# Patient Record
Sex: Female | Born: 1985 | Race: White | Hispanic: No | Marital: Single | Smoking: Never smoker
Health system: Southern US, Community
[De-identification: ages and names within clinical notes are randomized; demographics above are authoritative.]

## PROBLEM LIST (undated history)

## (undated) DIAGNOSIS — E039 Hypothyroidism, unspecified: Secondary | ICD-10-CM

---
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
## 2006-03-05 ENCOUNTER — Ambulatory Visit (HOSPITAL_COMMUNITY): Admission: RE | Admit: 2006-03-05 | Discharge: 2006-03-05 | Payer: Self-pay | Admitting: Family Medicine

## 2006-04-13 ENCOUNTER — Ambulatory Visit (HOSPITAL_COMMUNITY): Admission: RE | Admit: 2006-04-13 | Discharge: 2006-04-13 | Payer: Self-pay | Admitting: Family Medicine

## 2009-12-14 ENCOUNTER — Encounter: Admission: RE | Admit: 2009-12-14 | Discharge: 2009-12-14 | Payer: Self-pay | Admitting: Emergency Medicine

## 2011-04-12 ENCOUNTER — Other Ambulatory Visit: Payer: Self-pay | Admitting: Orthopedic Surgery

## 2011-04-12 DIAGNOSIS — M25532 Pain in left wrist: Secondary | ICD-10-CM

## 2011-04-18 ENCOUNTER — Ambulatory Visit
Admission: RE | Admit: 2011-04-18 | Discharge: 2011-04-18 | Disposition: A | Discharge status: Home / Self Care | Payer: 59 | Source: Ambulatory Visit | Attending: Orthopedic Surgery | Admitting: Orthopedic Surgery

## 2011-04-18 DIAGNOSIS — M25532 Pain in left wrist: Secondary | ICD-10-CM

## 2011-04-18 MED ORDER — GADOBENATE DIMEGLUMINE 529 MG/ML IV SOLN
0.1000 mL | Freq: Once | INTRAVENOUS | Status: AC | PRN
  Administered 2011-04-18: 0.1 mL via INTRAVENOUS

## 2011-04-18 MED ORDER — IOHEXOL 180 MG/ML  SOLN
1.0000 mL | Freq: Once | INTRAMUSCULAR | Status: AC | PRN
  Administered 2011-04-18: 1 mL via INTRA_ARTICULAR

## 2011-05-07 ENCOUNTER — Ambulatory Visit (HOSPITAL_BASED_OUTPATIENT_CLINIC_OR_DEPARTMENT_OTHER)
Admission: RE | Admit: 2011-05-07 | Discharge: 2011-05-07 | Disposition: A | Discharge status: Home / Self Care | Payer: 59 | Source: Ambulatory Visit | Attending: Orthopedic Surgery | Admitting: Orthopedic Surgery

## 2011-05-07 DIAGNOSIS — Y9345 Activity, cheerleading: Secondary | ICD-10-CM | POA: Insufficient documentation

## 2011-05-07 DIAGNOSIS — S63599A Other specified sprain of unspecified wrist, initial encounter: Secondary | ICD-10-CM | POA: Insufficient documentation

## 2011-05-07 DIAGNOSIS — X500XXA Overexertion from strenuous movement or load, initial encounter: Secondary | ICD-10-CM | POA: Insufficient documentation

## 2011-05-07 DIAGNOSIS — S66819A Strain of other specified muscles, fascia and tendons at wrist and hand level, unspecified hand, initial encounter: Secondary | ICD-10-CM | POA: Insufficient documentation

## 2011-05-07 DIAGNOSIS — Z01812 Encounter for preprocedural laboratory examination: Secondary | ICD-10-CM | POA: Insufficient documentation

## 2011-05-07 DIAGNOSIS — Y998 Other external cause status: Secondary | ICD-10-CM | POA: Insufficient documentation

## 2011-05-24 NOTE — Op Note (Signed)
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  PROCEDURES:  Left wrist arthroscopy, triangular fibrocartilage tear debridement and ulnar shortening osteotomy.  SURGEON:  Betha Loa, MD  ASSISTANT:  Cindee Salt, MD  ANESTHESIA:  General with regional.  IV FLUIDS:  Per anesthesia flow sheet.  ESTIMATED BLOOD LOSS:  Minimal.  COMPLICATIONS:  None.  SPECIMENS:  None.  TOURNIQUET TIME:  Eighty-two minutes.  DISPOSITION:  Stable to PACU.  INDICATIONS:  Ms. Dejager is a 25 year old white female who works as a Programmer, systems.  At the end of June while catching another individual, she had a forced supination of her wrist.  She felt a pop and had pain in the wrist.  She followed up with me in the office.  She has stable distal radioulnar joint.  She had pain at the ulnar side of the wrist.  An MRI was obtained showing evidence of ulnar carpal abutment with changes in the lunate as well as central TFCC tear.  She was on ulnar positive.  Discussed with Ms. Lingelbach the nature of her condition.  We discussed nonoperative treatment with splinting versus operative treatment with wrist arthroscopy, debridement and ulnar shortening osteotomy.  The risks, benefits, and alternative surgery were discussed including the risk of blood loss, infection, damage to nerves, vessels, tendons, ligaments bone, failure of procedure, need for additional procedures, complications with wound healing, continued pain, nonunion, malunion,  stiffness.  She voiced of these risks and elected to proceed.  OPERATIVE COURSE:  After being identified preoperatively by myself, the patient and I agreed upon procedure and site of procedure.  The surgery site was marked.  The risks, benefits, and alternatives of surgery were reviewed and she wished to proceed.  She was given 1 gram of IV Ancef as preoperative antibiotic prophylaxis.  Regional block was performed by Anesthesia in preoperative holding.  She was transferred to operating room, placed on the operating table in supine position with left upper extremity on arm board.  General esthesia was induced by anesthesiologist.  Left upper extremity was prepped and draped in normal sterile orthopedic fashion.  A surgical pause was performed between surgeons, Anesthesia, operating staff, and all in agreement with the patient procedure and site procedure.  The left upper extremity was secured in the arthroscopy tower.  A 3/4 arthroscopic portal was made by incising the skin using spreading technique in the deeper tissues.  The radiocarpal joint was entered.  The camera was used.  The joint was explored.  The scapholunate ligament was intact.  The lunotriquetral ligament was intact.  There was a central TFCC tear.  The volar and dorsal condensations were intact.  There was  no peripheral tear.  An 18- gauge needle had been placed on the ulnar side of the wrist for drainage.  The shaver was introduced through the 4/5 portal.  The central TFCC tear was debrided back to a stable edge.  The camera was placed through the 4/5 portal as well.  The ulnar side of the lunate was noted to be with some degenerative change, likely secondary to the abutment.  A 6-hour portal had been made for probing.  The arthroscopy equipment was removed.  The tourniquet at the proximal aspect of the upper extremity was inflated to 250 mmHg after exsanguination of limb with an Esmarch bandage.  A longitudinal incision  was made at the ulnar side of the forearm over the subcutaneous border of the ulna.  This was carried into the subcutaneous tissues by spreading technique.  Care was taken to protect all neurovascular structures.  Bipolar was used for electrocautery as necessary.  The interval between the FCU and ECU was identified and created.  The FCU was retracted radially.  The periosteum was incised and elevated using periosteal elevator and Therapist, nutritional. The Acumed ulnar osteotomy plate was used.  It was placed on the bone. The technique was followed.  The plate was secured at its distal screw hole with a locking screw.  Standard AO drilling measuring technique was used.  The peg was placed in the slotted hole more proximally.  K-wires were used to secure the plate and olive wire was placed proximally as well.  The cutting jig was placed.  The osteotomy cut was made to remove 2 mm of bone.  The oscillating saw was used to make the osteotomy.  The olive was removed.  The cutting jig was removed.  Compression clamp was used and good compression was obtained at the osteotomy site.  The compression screw was placed again using standard AO drilling measuring technique.  A lag screw was placed across the osteotomy site.  A locking screw had been placed distally prior to this for stabilization of the fragment.  The remaining distal hole was then filled again using a locking screw.  The most proximal hole was then filled with a locking screw and the slotted hole was filled with a nonlocking screw per the technique.  Good compression had been obtained at the osteotomy site.  C- arm was used in AP and lateral projections to ensure appropriate reduction of the osteotomy and good compression which was the case. Radiographs of the wrist showed good shortening of the ulna.  The wound was copiously irrigated with 500 mL of sterile saline.  The fascia was repaired using 4-0 Vicryl suture in a running fashion.  A  4-0 Vicryl was used in inverted fashion in the subcutaneous tissues and skin was closed with a running 4-0 Monocryl suture subcuticularly.  The wound was then stabilized using Steri-Strips.  The arthroscopy wounds were closed using the 4-0 Monocryl suture in an interrupted fashion.  The wounds were all dressed with sterile Xeroform, 4 x 4's, and wrapped with Kerlix bandage. A sugar-tong splints placed and wrapped with Kerlix and Ace bandage. The operative drapes were broken down, the tourniquet deflated at 82 minutes.  The fingertips were pink with brisk capillary refill after deflation of tourniquet.  Operative drapes were broken down.  The patient was awoken from anesthesia safely.  She is transferred back to stretcher and taken to PACU in stable condition.  I will see her back in the office in 1 week for postoperative  followup.  I will give her Percocet 5/325 one to two p.o. q.6 h. p.r.n. pain, dispensed #50.     Betha Loa, MD     KK/MEDQ  D:  05/07/2011  T:  05/08/2011  Job:  [id number]  Electronically Signed by Betha Loa  on 05/24/2011 10:13:56 AM

## 2013-08-01 ENCOUNTER — Encounter (HOSPITAL_BASED_OUTPATIENT_CLINIC_OR_DEPARTMENT_OTHER): Payer: Self-pay | Admitting: Emergency Medicine

## 2013-08-01 ENCOUNTER — Emergency Department (HOSPITAL_BASED_OUTPATIENT_CLINIC_OR_DEPARTMENT_OTHER)
Admission: EM | Admit: 2013-08-01 | Discharge: 2013-08-02 | Disposition: A | Discharge status: Home / Self Care | Payer: Self-pay | Attending: Emergency Medicine | Admitting: Emergency Medicine

## 2013-08-01 ENCOUNTER — Emergency Department (HOSPITAL_BASED_OUTPATIENT_CLINIC_OR_DEPARTMENT_OTHER): Payer: Self-pay

## 2013-08-01 DIAGNOSIS — Z79899 Other long term (current) drug therapy: Secondary | ICD-10-CM | POA: Insufficient documentation

## 2013-08-01 DIAGNOSIS — S5001XA Contusion of right elbow, initial encounter: Secondary | ICD-10-CM

## 2013-08-01 DIAGNOSIS — Y929 Unspecified place or not applicable: Secondary | ICD-10-CM | POA: Insufficient documentation

## 2013-08-01 DIAGNOSIS — Y9389 Activity, other specified: Secondary | ICD-10-CM | POA: Insufficient documentation

## 2013-08-01 DIAGNOSIS — X58XXXA Exposure to other specified factors, initial encounter: Secondary | ICD-10-CM | POA: Insufficient documentation

## 2013-08-01 DIAGNOSIS — S5000XA Contusion of unspecified elbow, initial encounter: Secondary | ICD-10-CM | POA: Insufficient documentation

## 2013-08-01 NOTE — ED Provider Notes (Signed)
CSN: [id number]     Arrival date & time 08/01/13  2247 History  This chart was scribed for Dione Booze, MD by Dorothey Baseman, ED Scribe. This patient was seen in room MH05/MH05 and the patient's care was started at 11:49 PM.    Chief Complaint  Patient presents with  . Elbow Pain   The history is provided by the patient. No language interpreter was used.   HPI Comments: Beth Dominguez is a 27 y.o. female who presents to the Emergency Department complaining of a constant pain, 8/10 currently, to the right elbow onset around 2 hours ago when the patient reports that she was breaking up a dog fight and either hit the elbow on the counter or fell on it. She reports that the pain is exacerbated with rotation and flexion. She reports allergies to doxycycline and Zithromax. Patient is a non-smoker and a social drinker. Patient denies any other pertinent medical history.   History reviewed. No pertinent past medical history. History reviewed. No pertinent past surgical history. History reviewed. No pertinent family history. History  Substance Use Topics  . Smoking status: Never Smoker   . Smokeless tobacco: Not on file  . Alcohol Use: Yes     Comment: social   OB History   Grav Para Term Preterm Abortions TAB SAB Ect Mult Living                 Review of Systems  A complete 10 system review of systems was obtained and all systems are negative except as noted in the HPI and PMH.   Allergies  Doxycycline and Zithromax  Home Medications   Current Outpatient Rx  Name  Route  Sig  Dispense  Refill  . venlafaxine (EFFEXOR) 75 MG tablet   Oral   Take 75 mg by mouth 2 (two) times daily.          Triage Vitals: BP 142/93  Temp(Src) 98.5 F (36.9 C) (Oral)  Resp 16  SpO2 100%  Physical Exam  Nursing note and vitals reviewed. Constitutional: She is oriented to person, place, and time. She appears well-developed and well-nourished. No distress.  HENT:  Head: Normocephalic and  atraumatic.  Eyes: Conjunctivae are normal.  Neck: Normal range of motion. Neck supple.  Pulmonary/Chest: Effort normal. No respiratory distress.  Abdominal: She exhibits no distension.  Musculoskeletal: Normal range of motion. She exhibits edema and tenderness.  Right elbow has mild swelling and tenderness posterolaterally. Moderate pain on full flexion of the right elbow. Distal neurovascular intact.   Neurological: She is alert and oriented to person, place, and time.  Skin: Skin is warm and dry.  Psychiatric: She has a normal mood and affect. Her behavior is normal.    ED Course  Procedures (including critical care time)  DIAGNOSTIC STUDIES: Oxygen Saturation is 100% on room air, normal by my interpretation.    COORDINATION OF CARE: 11:51 PM- Discussed negative x-ray results. Will discharge patient with a sling. Advised patient to apply ice to the area and to take ibuprofen or Advil at home to manage symptoms. Discussed treatment plan with patient at bedside and patient verbalized agreement.    Imaging Review Dg Elbow Complete Right  08/01/2013   CLINICAL DATA:  Injury of the right elbow complaining of pain in the right elbow.  EXAM: RIGHT ELBOW - COMPLETE 3+ VIEW  COMPARISON:  No priors.  FINDINGS: Multiple views of the right elbow demonstrate no acute displaced fracture, subluxation, dislocation, or soft tissue  abnormality.  IMPRESSION: No acute radiographic abnormality of the right elbow.   Electronically Signed   By: Trudie Reed M.D.   On: 08/01/2013 23:25   Images viewed by me.  MDM   1. Contusion of right elbow, initial encounter    Right elbow injury that appears to be contusion. X-ray shows no evidence of fracture and no fat pad sign. Patient is placed in a sling for comfort and reassured. She is discharged with instructions to use over-the-counter analgesics as needed for pain.  I personally performed the services described in this documentation, which was scribed  in my presence. The recorded information has been reviewed and is accurate.    Dione Booze, MD 08/02/13 [phone]

## 2013-08-01 NOTE — ED Notes (Signed)
Breaking up dog fight an hour ago, hit right elbow on counter or fell on it, not sure, but increased pain

## 2014-02-17 ENCOUNTER — Emergency Department (HOSPITAL_BASED_OUTPATIENT_CLINIC_OR_DEPARTMENT_OTHER): Payer: 59

## 2014-02-17 ENCOUNTER — Emergency Department (HOSPITAL_BASED_OUTPATIENT_CLINIC_OR_DEPARTMENT_OTHER)
Admission: EM | Admit: 2014-02-17 | Discharge: 2014-02-17 | Disposition: A | Discharge status: Home / Self Care | Payer: 59 | Attending: Emergency Medicine | Admitting: Emergency Medicine

## 2014-02-17 ENCOUNTER — Encounter (HOSPITAL_BASED_OUTPATIENT_CLINIC_OR_DEPARTMENT_OTHER): Payer: Self-pay | Admitting: Emergency Medicine

## 2014-02-17 DIAGNOSIS — O9989 Other specified diseases and conditions complicating pregnancy, childbirth and the puerperium: Secondary | ICD-10-CM | POA: Insufficient documentation

## 2014-02-17 DIAGNOSIS — O210 Mild hyperemesis gravidarum: Secondary | ICD-10-CM | POA: Insufficient documentation

## 2014-02-17 DIAGNOSIS — E079 Disorder of thyroid, unspecified: Secondary | ICD-10-CM | POA: Insufficient documentation

## 2014-02-17 DIAGNOSIS — Z79899 Other long term (current) drug therapy: Secondary | ICD-10-CM | POA: Insufficient documentation

## 2014-02-17 DIAGNOSIS — R109 Unspecified abdominal pain: Secondary | ICD-10-CM | POA: Insufficient documentation

## 2014-02-17 DIAGNOSIS — O219 Vomiting of pregnancy, unspecified: Secondary | ICD-10-CM

## 2014-02-17 DIAGNOSIS — E039 Hypothyroidism, unspecified: Secondary | ICD-10-CM | POA: Insufficient documentation

## 2014-02-17 DIAGNOSIS — O99280 Endocrine, nutritional and metabolic diseases complicating pregnancy, unspecified trimester: Secondary | ICD-10-CM

## 2014-02-17 HISTORY — DX: Hypothyroidism, unspecified: E03.9

## 2014-02-17 LAB — URINALYSIS, ROUTINE W REFLEX MICROSCOPIC
BILIRUBIN URINE: NEGATIVE
Glucose, UA: NEGATIVE mg/dL
Hgb urine dipstick: NEGATIVE
Ketones, ur: NEGATIVE mg/dL
NITRITE: NEGATIVE
PH: 6 (ref 5.0–8.0)
Protein, ur: NEGATIVE mg/dL
SPECIFIC GRAVITY, URINE: 1.025 (ref 1.005–1.030)
Urobilinogen, UA: 1 mg/dL (ref 0.0–1.0)

## 2014-02-17 LAB — PREGNANCY, URINE: PREG TEST UR: POSITIVE — AB

## 2014-02-17 LAB — URINE MICROSCOPIC-ADD ON

## 2014-02-17 MED ORDER — PRENATAL COMPLETE 14-0.4 MG PO TABS: ORAL_TABLET | ORAL | Status: AC

## 2014-02-17 MED ORDER — PROMETHAZINE HCL 25 MG PO TABS
25.0000 mg | ORAL_TABLET | Freq: Once | ORAL | Status: AC
  Administered 2014-02-17: 25 mg via ORAL
  Filled 2014-02-17: qty 1

## 2014-02-17 MED ORDER — ONDANSETRON HCL 4 MG/2ML IJ SOLN
4.0000 mg | Freq: Once | INTRAMUSCULAR | Status: AC
  Administered 2014-02-17: 4 mg via INTRAVENOUS

## 2014-02-17 MED ORDER — SODIUM CHLORIDE 0.9 % IV SOLN
Freq: Once | INTRAVENOUS | Status: AC
  Administered 2014-02-17: 19:00:00 via INTRAVENOUS

## 2014-02-17 MED ORDER — ONDANSETRON HCL 4 MG/2ML IJ SOLN
4.0000 mg | Freq: Once | INTRAMUSCULAR | Status: DC
  Filled 2014-02-17: qty 2

## 2014-02-17 MED ORDER — ONDANSETRON 4 MG PO TBDP: 4.0000 mg | ORAL_TABLET | Freq: Three times a day (TID) | ORAL | Status: AC | PRN

## 2014-02-17 NOTE — Discharge Instructions (Signed)
Morning Sickness °Morning sickness is when you feel sick to your stomach (nauseous) during pregnancy. This nauseous feeling may or may not come with vomiting. It often occurs in the morning but can be a problem any time of day. Morning sickness is most common during the first trimester, but it may continue throughout pregnancy. While morning sickness is unpleasant, it is usually harmless unless you develop severe and continual vomiting (hyperemesis gravidarum). This condition requires more intense treatment.  °CAUSES  °The cause of morning sickness is not completely known but seems to be related to normal hormonal changes that occur in pregnancy. °RISK FACTORS °You are at greater risk if you: °· Experienced nausea or vomiting before your pregnancy. °· Had morning sickness during a previous pregnancy. °· Are pregnant with more than one baby, such as twins. °TREATMENT  °Do not use any medicines (prescription, over-the-counter, or herbal) for morning sickness without first talking to your health care provider. Your health care provider may prescribe or recommend: °· Vitamin B6 supplements. °· Anti-nausea medicines. °· The herbal medicine ginger. °HOME CARE INSTRUCTIONS  °· Only take over-the-counter or prescription medicines as directed by your health care provider. °· Taking multivitamins before getting pregnant can prevent or decrease the severity of morning sickness in most women.   °· Eat a piece of dry toast or unsalted crackers before getting out of bed in the morning.   °· Eat five or six small meals a day.   °· Eat dry and bland foods (rice, baked potato ). Foods high in carbohydrates are often helpful.  °· Do not drink liquids with your meals. Drink liquids between meals.   °· Avoid greasy, fatty, and spicy foods.   °· Get someone to Daye for you if the smell of any food causes nausea and vomiting.   °· If you feel nauseous after taking prenatal vitamins, take the vitamins at night or with a snack.  °· Snack  on protein foods (nuts, yogurt, cheese) between meals if you are hungry.   °· Eat unsweetened gelatins for desserts.   °· Wearing an acupressure wristband (worn for sea sickness) may be helpful.   °· Acupuncture may be helpful.   °· Do not smoke.   °· Get a humidifier to keep the air in your house free of odors.   °· Get plenty of fresh air. °SEEK MEDICAL CARE IF:  °· Your home remedies are not working, and you need medicine. °· You feel dizzy or lightheaded. °· You are losing weight. °SEEK IMMEDIATE MEDICAL CARE IF:  °· You have persistent and uncontrolled nausea and vomiting. °· You pass out (faint). °Document Released: 11/07/2006 Document Revised: 05/19/2013 Document Reviewed: 03/03/2013 °[employer]® Patient Information ©2014 [employer]. ° °

## 2014-02-17 NOTE — ED Notes (Signed)
rx x 2 given for phenergan and prenatal vitamins- d/c home with ride

## 2014-02-17 NOTE — ED Notes (Signed)
Patient transported to Ultrasound 

## 2014-02-17 NOTE — ED Provider Notes (Signed)
Medical screening examination/treatment/procedure(s) were performed by non-physician practitioner and as supervising physician I was immediately available for consultation/collaboration.   EKG Interpretation None        Kristen N Ward, DO 02/17/14 2044 

## 2014-02-17 NOTE — ED Notes (Signed)
Pt. Reports she is not able to keep food down and is [redacted] wks pregnant.

## 2014-02-17 NOTE — ED Provider Notes (Signed)
CSN: [id number]     Arrival date & time 02/17/14  1714 History   First MD Initiated Contact with Patient 02/17/14 1731     Chief Complaint  Patient presents with  . Abdominal Pain  . Morning Sickness     (Consider location/radiation/quality/duration/timing/severity/associated sxs/prior Treatment) Patient is a 28 y.o. female presenting with vomiting. The history is provided by the patient. No language interpreter was used.  Emesis Severity:  Moderate Timing:  Constant Number of daily episodes:  Multiple Progression:  Worsening Chronicity:  New Relieved by:  Nothing Worsened by:  Nothing tried Ineffective treatments:  None tried Associated symptoms: abdominal pain   Risk factors: alcohol use and pregnant now     Past Medical History  Diagnosis Date  . Hypoactive thyroid    History reviewed. No pertinent past surgical history. No family history on file. History  Substance Use Topics  . Smoking status: Never Smoker   . Smokeless tobacco: Not on file  . Alcohol Use: Yes     Comment: social   OB History   Grav Para Term Preterm Abortions TAB SAB Ect Mult Living   1              Review of Systems  Gastrointestinal: Positive for vomiting and abdominal pain.  All other systems reviewed and are negative.     Allergies  Doxycycline and Zithromax  Home Medications   Prior to Admission medications   Medication Sig Start Date End Date Taking? Authorizing Provider  levothyroxine (SYNTHROID, LEVOTHROID) 25 MCG tablet Take 25 mcg by mouth daily before breakfast.   Yes Historical Provider, MD  venlafaxine (EFFEXOR) 75 MG tablet Take 75 mg by mouth 2 (two) times daily.    Historical Provider, MD   BP 124/86  Pulse 78  Temp(Src) 98.1 F (36.7 C) (Oral)  Resp 16  Ht 5\' 6"  (1.676 m)  Wt 130 lb (58.968 kg)  BMI 20.99 kg/m2  SpO2 100% Physical Exam  Nursing note and vitals reviewed. Constitutional: She is oriented to person, place, and time. She appears well-developed  and well-nourished.  HENT:  Head: Normocephalic.  Eyes: EOM are normal. Pupils are equal, round, and reactive to light.  Neck: Normal range of motion.  Pulmonary/Chest: Effort normal.  Abdominal: Soft. She exhibits no distension.  Musculoskeletal: Normal range of motion.  Neurological: She is alert and oriented to person, place, and time.  Skin: Skin is warm.  Psychiatric: She has a normal mood and affect.    ED Course  Procedures (including critical care time) Labs Review Labs Reviewed  URINALYSIS, ROUTINE W REFLEX MICROSCOPIC - Abnormal; Notable for the following:    Color, Urine AMBER (*)    APPearance CLOUDY (*)    Leukocytes, UA SMALL (*)    All other components within normal limits  PREGNANCY, URINE - Abnormal; Notable for the following:    Preg Test, Ur POSITIVE (*)    All other components within normal limits  URINE MICROSCOPIC-ADD ON - Abnormal; Notable for the following:    Squamous Epithelial / LPF MANY (*)    Bacteria, UA FEW (*)    All other components within normal limits    Imaging Review No results found.   EKG Interpretation None      MDM [place] shows 6w 4 d intrauterine gestational sac   Final diagnoses:  Nausea and vomiting in pregnancy     Pt given rx for prenatal vitamins and zofran.  Pt counseled on prenatal care.   [facility] if any problems   Elson AreasLeslie K Sofia, PA-C 02/17/14 2019

## 2014-08-01 ENCOUNTER — Encounter (HOSPITAL_BASED_OUTPATIENT_CLINIC_OR_DEPARTMENT_OTHER): Payer: Self-pay | Admitting: Emergency Medicine

## 2019-06-09 ENCOUNTER — Encounter: Payer: Self-pay | Admitting: Physical Medicine and Rehabilitation

## 2019-06-09 ENCOUNTER — Ambulatory Visit (INDEPENDENT_AMBULATORY_CARE_PROVIDER_SITE_OTHER): Payer: BLUE CROSS/BLUE SHIELD | Admitting: Physical Medicine and Rehabilitation

## 2019-06-09 VITALS — BP 134/89 | HR 89

## 2019-06-09 DIAGNOSIS — G8929 Other chronic pain: Secondary | ICD-10-CM | POA: Diagnosis not present

## 2019-06-09 DIAGNOSIS — M5116 Intervertebral disc disorders with radiculopathy, lumbar region: Secondary | ICD-10-CM | POA: Diagnosis not present

## 2019-06-09 DIAGNOSIS — M5442 Lumbago with sciatica, left side: Secondary | ICD-10-CM | POA: Diagnosis not present

## 2019-06-09 DIAGNOSIS — M5416 Radiculopathy, lumbar region: Secondary | ICD-10-CM

## 2019-06-09 NOTE — Progress Notes (Signed)
  Numeric Pain Rating Scale and Functional Assessment Average Pain 7 Pain Right Now 1 My pain is constant and sharp Pain is worse with: standing Pain improves with: noting   In the last MONTH (on 0-10 scale) has pain interfered with the following?  1. General activity like being  able to carry out your everyday physical activities such as walking, climbing stairs, carrying groceries, or moving a chair?  Rating(4)  2. Relation with others like being able to carry out your usual social activities and roles such as  activities at home, at work and in your community. Rating(4)  3. Enjoyment of life such that you have  been bothered by emotional problems such as feeling anxious, depressed or irritable?  Rating(2)

## 2019-06-21 ENCOUNTER — Encounter: Payer: Self-pay | Admitting: Physical Medicine and Rehabilitation

## 2019-06-21 NOTE — Progress Notes (Signed)
Beth Dominguez - 33 y.o. female MRN [id number]  Date of birth: [birth year]  Office Visit Note: Visit Date: 06/09/2019 PCP: Primus Bravo, NP Referred by: Primus Bravo, NP  Subjective: Chief Complaint  Patient presents with   Lower Back - Pain   Left Hip - Pain   HPI: Beth Dominguez is a 32 y.o. female who comes in today As a self-referral for evaluation management of worsening low back pain particularly left hip and buttock with spasming that began in November of last year.  She reports average pain of 7 out of 10 constant sharp pain worse with standing and really has not found anything that will improve the pain.  She has been to her chiropractor on several occasions and she is also been in physical therapy through [facility] in [place].  It is really limiting her activities and even general activities are limited to a moderate degree.  She reports in November really not having any specific injury but a significant lower back spasm on the left side really at the sort of L4 and lumbosacral junction.  Some referral pattern into the lateral and posterior buttock.  No groin pain.  No right-sided complaints.  No paresthesias.  This was initially handled through her primary care physician in the chiropractor.  She did have anti-inflammatories off and on that helped a little bit.  Ultimately she sought treatment at [facility] in [place] and saw Dr. Lattie Corns and according to their notes was diagnosed essentially with lumbar strain.  She was given Medrol Dosepak and Zanaflex.  She reports this did not really seem to help that much.  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  She was asked at some point during the course of this to do a little bit more competition than she is used to doing at her age at this point and she thinks that just flared up an existing condition.  It does not appear that she has had recent x-rays although she may have  had x-rays at the chiropractor.  She actually was evaluated for lumbar spine issue when she was much younger.  There is MRI report from 2007 that was ordered by Dr. Althea Charon who works at [employer].  That MRI was essentially normal at the time however that was a long time ago but at least it shows there is no pars defects which can be common in cheerleaders and gymnasts.  Review of Systems  Constitutional: Negative for chills, fever, malaise/fatigue and weight loss.  HENT: Negative for hearing loss and sinus pain.   Eyes: Negative for blurred vision, double vision and photophobia.  Respiratory: Negative for cough and shortness of breath.   Cardiovascular: Negative for chest pain, palpitations and leg swelling.  Gastrointestinal: Negative for abdominal pain, nausea and vomiting.  Genitourinary: Negative for flank pain.  Musculoskeletal: Positive for back pain and joint pain. Negative for myalgias.  Skin: Negative for itching and rash.  Neurological: Negative for tremors, focal weakness and weakness.  Endo/Heme/Allergies: Negative.   Psychiatric/Behavioral: Negative for depression.  All other systems reviewed and are negative.  Otherwise per HPI.  Assessment & Plan: Visit Diagnoses:  1. Intervertebral disc disorders with radiculopathy, lumbar region   2. Lumbar radiculopathy   3. 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  Her pain continues to be left lower back and upper buttock pain seemingly probably the L4-L5 region.  No radicular complaints past the knee but it seems like a radicular component to the buttock.  Could effectively rule out  piriformis and other conditions as she has had good conservative treatment at this point.  I do think best approach at this point is a consistent anti-inflammatory for a couple of weeks and will provide meloxicam.  I also want to get her in for lumbar MRI.  I think at this point is warranted do to the severity of the symptoms and the length of time that she has had this in the fact that it is limiting her ability to function.  Exam is consistent with lumbar radicular pain and probably discogenic or disc protrusion.  We will see her back after the MRI for further treatment plans.  She will continue with current exercises and avoid impact exercises.    Meds & Orders: No orders of the defined types were placed in this encounter.   Orders Placed This Encounter  Procedures   MR LUMBAR SPINE WO CONTRAST    Follow-up: Return for MRI review after completion.   Procedures: No procedures performed  No notes on file   Clinical History: 03/2006 MRI LUMBAR SPINE WITHOUT CONTRAST:   Technique: Multiplanar and multiecho pulse sequences of the lumbar spine, to include the lower thoracic and upper sacral regions, were obtained according to standard protocol without IV contrast.   Findings: Alignment of the spine is normal. The intervertebral disks are normal in signal characteristics and morphology. I do not see any osseous or articular pathology. I don't see a pars defect or any stress phenomena.   IMPRESSION:   Entirely normal examination. No cause of low left back pain identified. Specifically, I don't see a pars defect or any posterior element stress phenomena.   She reports that she has never smoked. She has never used smokeless tobacco. No results for input(s): HGBA1C, LABURIC in the last 8760 hours.  Objective:  VS:  HT:     WT:    BMI:      BP:134/89   HR:89bpm   TEMP: ( )   RESP:  Physical Exam Vitals signs and nursing note reviewed.  Constitutional:      General: She is not in acute distress.     Appearance: Normal appearance. She is well-developed.  HENT:     Head: Normocephalic and atraumatic.     Nose: Nose normal.     Mouth/Throat:     Mouth: Mucous membranes are moist.     Pharynx: Oropharynx is clear.  Eyes:     Conjunctiva/sclera: Conjunctivae normal.     Pupils: Pupils are equal, round, and reactive to light.  Neck:     Musculoskeletal: Normal range of motion and neck supple.  Cardiovascular:     Rate and Rhythm: Regular rhythm.  Pulmonary:     Effort: Pulmonary effort is normal. No respiratory distress.  Abdominal:     General: There is no distension.     Palpations: Abdomen is soft.     Tenderness: There is no guarding.  Musculoskeletal:     Right lower leg: No edema.     Left lower leg: No edema.     Comments: Patient ambulates without aid with a somewhat antalgic  gait to the left.  She has pain with forward flexion and some pain with extension rotation to the left.  No focal trigger point noted although is tight musculature paraspinally.  Some pain over the PSIS on the left but not on the right.  Mild pain over the greater trochanter but not consistent.  Subjectively positive slump test on the left causing buttock and hamstring pain but she in general has pretty loose hamstrings.  Good distal strength without clonus.  Skin:    General: Skin is warm and dry.     Findings: No erythema or rash.  Neurological:     General: No focal deficit present.     Mental Status: She is alert and oriented to person, place, and time.     Motor: No abnormal muscle tone.     Coordination: Coordination normal.     Gait: Gait normal.  Psychiatric:        Mood and Affect: Mood normal.        Behavior: Behavior normal.        Thought Content: Thought content normal.     Ortho Exam Imaging: No results found.  Past Medical/Family/Surgical/Social History: Medications & Allergies reviewed per EMR, new medications updated. There are no active problems to display for this  patient.  Past Medical History:  Diagnosis Date   Hypoactive thyroid    History reviewed. No pertinent family history. History reviewed. No pertinent surgical history. Social History   Occupational History   Not on file  Tobacco Use   Smoking status: Never Smoker   Smokeless tobacco: Never Used  Substance and Sexual Activity   Alcohol use: Yes    Comment: social   Drug use: No   Sexual activity: Not on file

## 2019-06-26 ENCOUNTER — Other Ambulatory Visit: Payer: BLUE CROSS/BLUE SHIELD

## 2019-06-28 ENCOUNTER — Other Ambulatory Visit: Payer: Self-pay

## 2019-06-28 ENCOUNTER — Ambulatory Visit
Admission: RE | Admit: 2019-06-28 | Discharge: 2019-06-28 | Disposition: A | Discharge status: Home / Self Care | Payer: BLUE CROSS/BLUE SHIELD | Source: Ambulatory Visit | Attending: Physical Medicine and Rehabilitation | Admitting: Physical Medicine and Rehabilitation

## 2019-06-28 DIAGNOSIS — M5116 Intervertebral disc disorders with radiculopathy, lumbar region: Secondary | ICD-10-CM

## 2019-06-29 ENCOUNTER — Telehealth: Payer: Self-pay | Admitting: Physical Medicine and Rehabilitation

## 2019-06-29 NOTE — Telephone Encounter (Signed)
Called pt insurance and spoke with Fonnie Birkenhead and he states no prior Josem Kaufmann is needed for [id number]  Reference# [id number]

## 2019-07-22 ENCOUNTER — Encounter: Payer: BLUE CROSS/BLUE SHIELD | Admitting: Physical Medicine and Rehabilitation

## 2019-08-12 ENCOUNTER — Ambulatory Visit (INDEPENDENT_AMBULATORY_CARE_PROVIDER_SITE_OTHER): Payer: BLUE CROSS/BLUE SHIELD | Admitting: Physical Medicine and Rehabilitation

## 2019-08-12 ENCOUNTER — Other Ambulatory Visit: Payer: Self-pay

## 2019-08-12 ENCOUNTER — Ambulatory Visit: Payer: Self-pay

## 2019-08-12 ENCOUNTER — Encounter: Payer: Self-pay | Admitting: Physical Medicine and Rehabilitation

## 2019-08-12 VITALS — BP 138/86 | HR 62

## 2019-08-12 DIAGNOSIS — M5116 Intervertebral disc disorders with radiculopathy, lumbar region: Secondary | ICD-10-CM

## 2019-08-12 MED ORDER — BETAMETHASONE SOD PHOS & ACET 6 (3-3) MG/ML IJ SUSP
12.0000 mg | Freq: Once | INTRAMUSCULAR | Status: AC
  Administered 2019-08-12: 12 mg

## 2019-08-12 NOTE — Progress Notes (Signed)
   Numeric Pain Rating Scale and Functional Assessment Average Pain 4   In the last MONTH (on 0-10 scale) has pain interfered with the following?  1. General activity like being  able to carry out your everyday physical activities such as walking, climbing stairs, carrying groceries, or moving a chair?  Rating(7)   +Driver, -BT, -Dye Allergies.   

## 2019-12-20 NOTE — Procedures (Signed)
S1 Lumbosacral Transforaminal Epidural Steroid Injection - Sub-Pedicular Approach with Fluoroscopic Guidance   Patient: Beth Dominguez      Date of Birth: May 12, 1986 MRN: [id number] PCP: Patient, No Pcp Per      Visit Date: 08/12/2019   Universal Protocol:    Date/Time: 03/22/216:09 AM  Consent Given By: the patient  Position:  PRONE  Additional Comments: Vital signs were monitored before and after the procedure. Patient was prepped and draped in the usual sterile fashion. The correct patient, procedure, and site was verified.   Injection Procedure Details:  Procedure Site One Meds Administered:  Meds ordered this encounter  Medications  . betamethasone acetate-betamethasone sodium phosphate (CELESTONE) injection 12 mg    Laterality: Left  Location/Site:  S1 Foramen   Needle size: 22 ga.  Needle type: Spinal  Needle Placement: Transforaminal  Findings:   -Comments: Excellent flow of contrast along the nerve and into the epidural space.  Epidurogram: Contrast epidurogram showed no nerve root cut off or restricted flow pattern.  Procedure Details: After squaring off the sacral end-plate to get a true AP view, the C-arm was positioned so that the best possible view of the S1 foramen was visualized. The soft tissues overlying this structure were infiltrated with 2-3 ml. of 1% Lidocaine without Epinephrine.    The spinal needle was inserted toward the target using a "trajectory" view along the fluoroscope beam.  Under AP and lateral visualization, the needle was advanced so it did not puncture dura. Biplanar projections were used to confirm position. Aspiration was confirmed to be negative for CSF and/or blood. A 1-2 ml. volume of Isovue-250 was injected and flow of contrast was noted at each level. Radiographs were obtained for documentation purposes.   After attaining the desired flow of contrast documented above, a 0.5 to 1.0 ml test dose of 0.25% Marcaine was injected  into each respective transforaminal space.  The patient was observed for 90 seconds post injection.  After no sensory deficits were reported, and normal lower extremity motor function was noted,   the above injectate was administered so that equal amounts of the injectate were placed at each foramen (level) into the transforaminal epidural space.   Additional Comments:  The patient tolerated the procedure well Dressing: Band-Aid with 2 x 2 sterile gauze    Post-procedure details: Patient was observed during the procedure. Post-procedure instructions were reviewed.  Patient left the clinic in stable condition.

## 2019-12-20 NOTE — Progress Notes (Signed)
Beth Dominguez - 34 y.o. female MRN [id number]  Date of birth: Apr 28, 1986  Office Visit Note: Visit Date: 08/12/2019 PCP: Patient, No Pcp Per Referred by: No ref. provider found  Subjective: Chief Complaint  Patient presents with  . Lower Back - Pain   HPI:  Beth Dominguez is a 34 y.o. female who comes in today For planned left S1 transforaminal epidural steroid injection.  Patient is having left sided lower back and buttock pain.  We did obtain MRI of the lumbar spine and this is reviewed with her again today.  And reviewed below.  Shows disc foraminal protrusion and lateral recess at L5-S1.  This does fit with her symptoms.  She is failed conservative care otherwise.  The patient has failed conservative care including home exercise, medications, time and activity modification.  This injection will be diagnostic and hopefully therapeutic.  Please see requesting physician notes for further details and justification.   ROS Otherwise per HPI.  Assessment & Plan: Visit Diagnoses:  1. Radiculopathy due to lumbar intervertebral disc disorder     Plan: No additional findings.   Meds & Orders:  Meds ordered this encounter  Medications  . betamethasone acetate-betamethasone sodium phosphate (CELESTONE) injection 12 mg    Orders Placed This Encounter  Procedures  . XR C-ARM NO REPORT  . Epidural Steroid injection    Follow-up: Return if symptoms worsen or fail to improve.   Procedures: No procedures performed  S1 Lumbosacral Transforaminal Epidural Steroid Injection - Sub-Pedicular Approach with Fluoroscopic Guidance   Patient: Beth Dominguez      Date of Birth: [birth year] MRN: [id number] PCP: Patient, No Pcp Per      Visit Date: 08/12/2019   Universal Protocol:    Date/Time: 03/22/216:09 AM  Consent Given By: the patient  Position:  PRONE  Additional Comments: Vital signs were monitored before and after the procedure. Patient was prepped and draped in the usual sterile  fashion. The correct patient, procedure, and site was verified.   Injection Procedure Details:  Procedure Site One Meds Administered:  Meds ordered this encounter  Medications  . betamethasone acetate-betamethasone sodium phosphate (CELESTONE) injection 12 mg    Laterality: Left  Location/Site:  S1 Foramen   Needle size: 22 ga.  Needle type: Spinal  Needle Placement: Transforaminal  Findings:   -Comments: Excellent flow of contrast along the nerve and into the epidural space.  Epidurogram: Contrast epidurogram showed no nerve root cut off or restricted flow pattern.  Procedure Details: After squaring off the sacral end-plate to get a true AP view, the C-arm was positioned so that the best possible view of the S1 foramen was visualized. The soft tissues overlying this structure were infiltrated with 2-3 ml. of 1% Lidocaine without Epinephrine.    The spinal needle was inserted toward the target using a "trajectory" view along the fluoroscope beam.  Under AP and lateral visualization, the needle was advanced so it did not puncture dura. Biplanar projections were used to confirm position. Aspiration was confirmed to be negative for CSF and/or blood. A 1-2 ml. volume of Isovue-250 was injected and flow of contrast was noted at each level. Radiographs were obtained for documentation purposes.   After attaining the desired flow of contrast documented above, a 0.5 to 1.0 ml test dose of 0.25% Marcaine was injected into each respective transforaminal space.  The patient was observed for 90 seconds post injection.  After no sensory deficits were reported, and normal lower extremity motor  function was noted,   the above injectate was administered so that equal amounts of the injectate were placed at each foramen (level) into the transforaminal epidural space.   Additional Comments:  The patient tolerated the procedure well Dressing: Band-Aid with 2 x 2 sterile gauze    Post-procedure  details: Patient was observed during the procedure. Post-procedure instructions were reviewed.  Patient left the clinic in stable condition.     Clinical History: MRI LUMBAR SPINE WITHOUT CONTRAST  TECHNIQUE: Multiplanar, multisequence MR imaging of the lumbar spine was performed. No intravenous contrast was administered.  COMPARISON:  MRI lumbar spine dated April 13, 2006.  FINDINGS: Segmentation: Assumed standard. The last well-formed disc space is designated L5-S1 for the purposes of this report.  Alignment:  Physiologic.  Vertebrae:  No fracture, evidence of discitis, or bone lesion.  Conus medullaris and cauda equina: Conus extends to the L1-L2 level. Conus and cauda equina appear normal.  Paraspinal and other soft tissues: 1.0 cm simple cyst in the lower pole of the right kidney. Otherwise negative.  Disc levels:  T11-T12 to L2-L3: Negative.  L3-L4: Minimal disc desiccation without significant disc bulge or herniation. No stenosis.  L4-L5: Minimal disc desiccation without significant disc bulge or herniation. No stenosis.  L5-S1: Small focal broad-based left subarticular and foraminal disc protrusion with annular fissure. Posterior displacement of the descending left S1 nerve root without impingement. New mild left neuroforaminal stenosis. No spinal canal or right neuroforaminal stenosis.  IMPRESSION: 1. New broad-based left subarticular and foraminal disc protrusion/annular fissure at L5-S1 with potential irritation of the descending left S1 nerve root but no clear impingement. Mild left neuroforaminal stenosis. 2. Minimal disc degeneration at L3-L4 and L4-L5.   Electronically Signed   By: Titus Dubin M.D.   On: 06/28/2019 12:32 ------- 03/2006 MRI LUMBAR SPINE WITHOUT CONTRAST:  Technique: Multiplanar and multiecho pulse sequences of the lumbar spine, to include the lower thoracic and upper sacral regions, were obtained according  to standard protocol without IV contrast.  Findings: Alignment of the spine is normal. The intervertebral disks are normal in signal characteristics and morphology. I do not see any osseous or articular pathology. I don't see a pars defect or any stress phenomena.  IMPRESSION:  Entirely normal examination. No cause of low left back pain identified. Specifically, I don't see a pars defect or any posterior element stress phenomena.     Objective:  VS:  HT:    WT:   BMI:     BP:138/86  HR:62bpm  TEMP: ( )  RESP:  Physical Exam  Ortho Exam Imaging: No results found.

## 2021-05-29 IMAGING — MR MR LUMBAR SPINE W/O CM
5 series · 44 of 48 positions shown · non-contrast
Comparison: MRI lumbar spine dated April 13, 2006.

CLINICAL DATA: Chronic intermittent left-sided low back pain
radiating into the left groin and lateral leg.

EXAM:
MRI LUMBAR SPINE WITHOUT CONTRAST
TECHNIQUE: Multiplanar, multisequence MR imaging of the lumbar spine was
performed. No intravenous contrast was administered.

[Series 3: T2 · sagittal · 4.0mm · 0.88mm/px · 6 of 14 slices shown (1 of 2)]
[im 1/14]
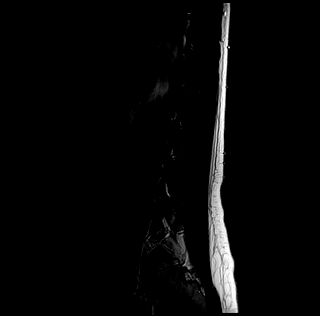
[im 3/14]
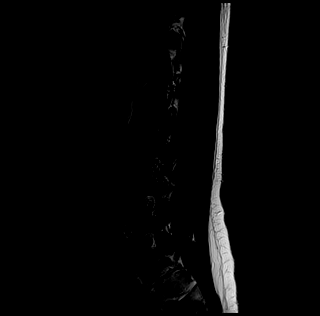
[im 6/14]
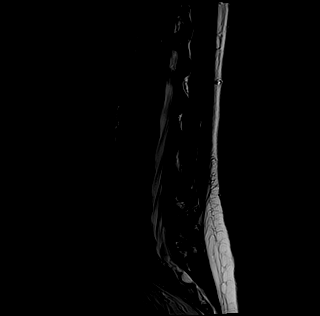
[im 8/14]
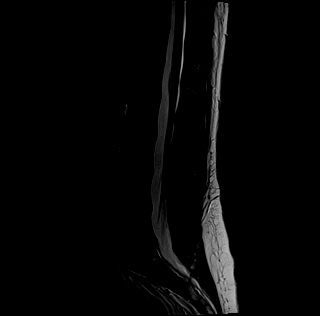
[im 11/14]
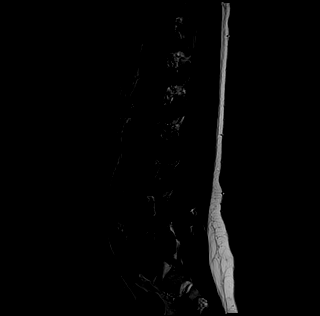
[im 14/14]
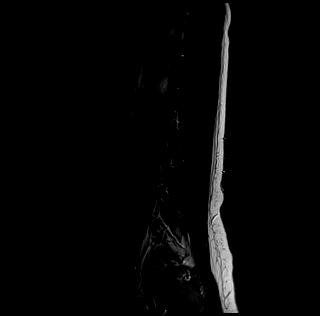

[Series 4: tirm sag · sagittal · 4.0mm · 0.55mm/px · 6 of 14 slices shown]
[im 1/14]
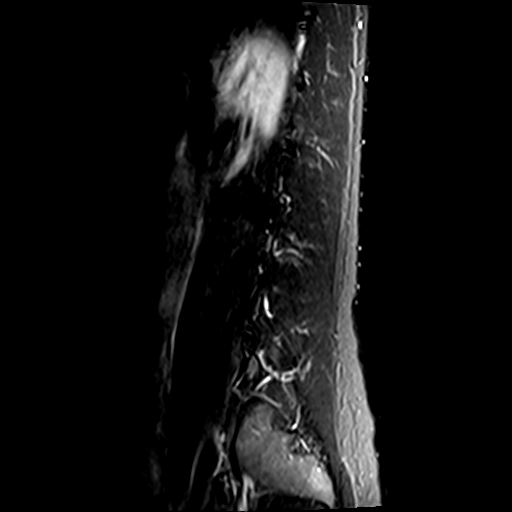
[im 3/14]
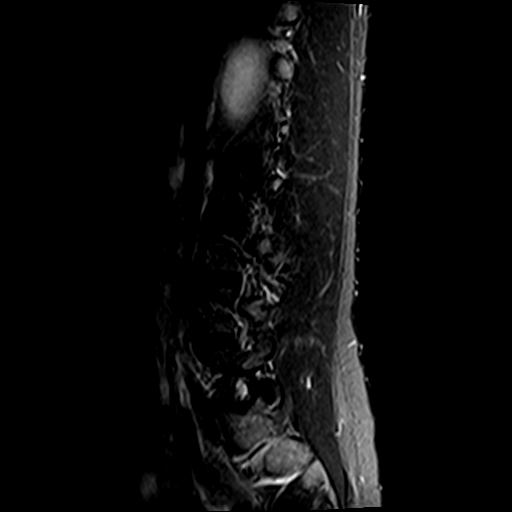
[im 6/14]
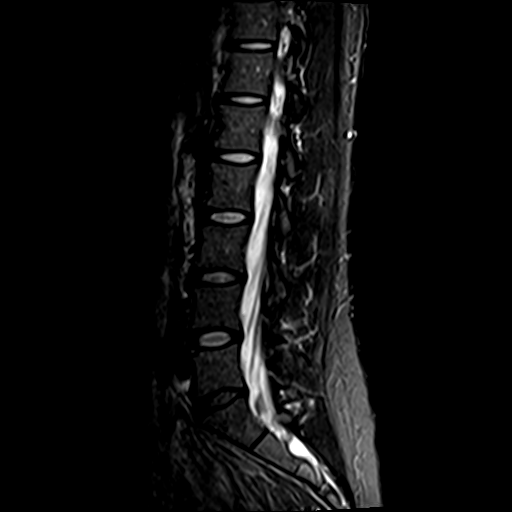
[im 8/14]
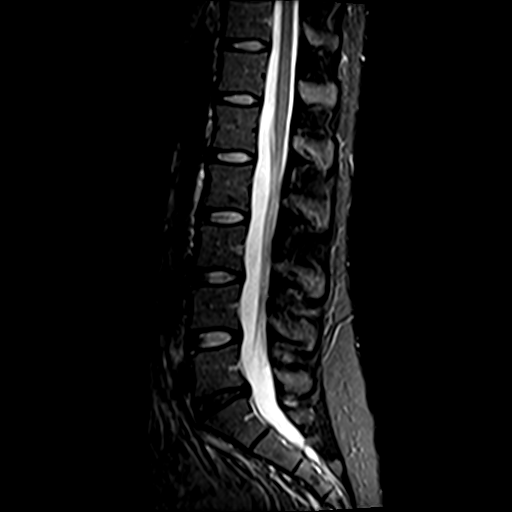
[im 11/14]
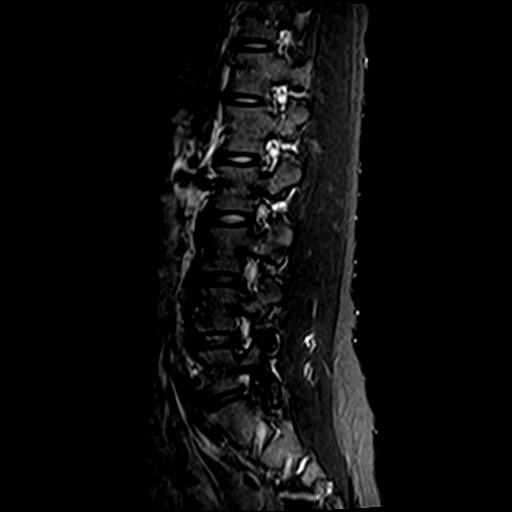
[im 14/14]
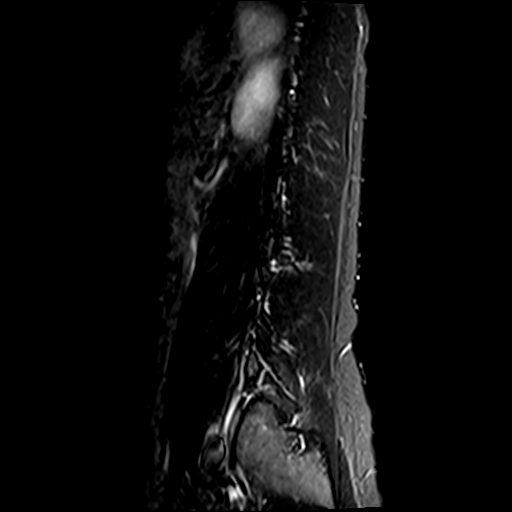

[Series 5: T1 · sagittal · 4.0mm · 0.88mm/px · 6 of 14 slices shown (1 of 2)]
[im 1/14]
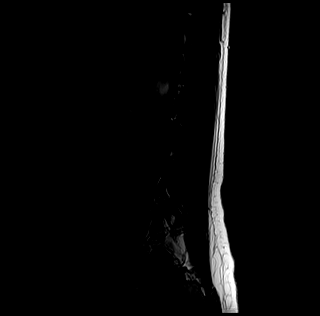
[im 3/14]
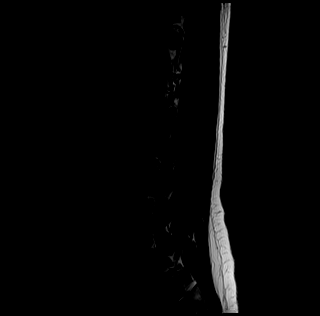
[im 6/14]
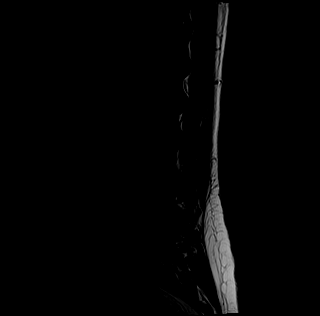
[im 8/14]
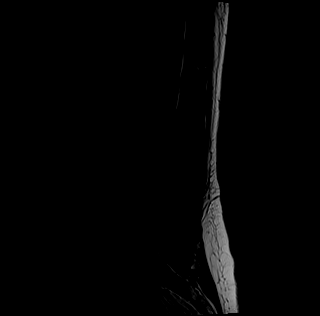
[im 11/14]
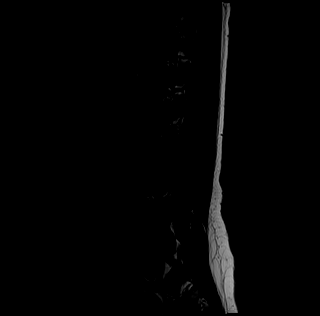
[im 14/14]
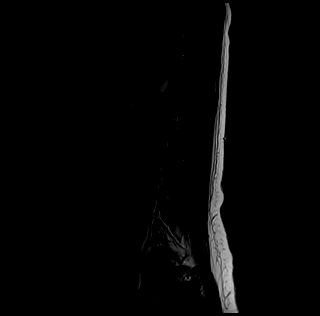

[Series 6: T1 · axial · 4.0mm · 0.70mm/px · z∈[-157,+46]mm · 11 of 38 slices shown (2 of 2)]
[im 1/38]
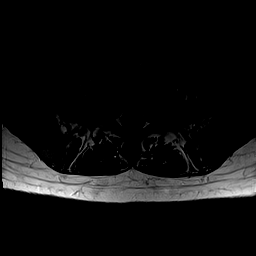
[im 3/38]
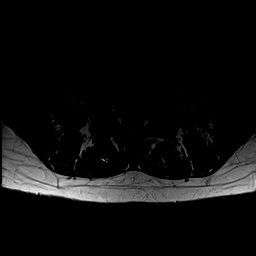
[im 6/38]
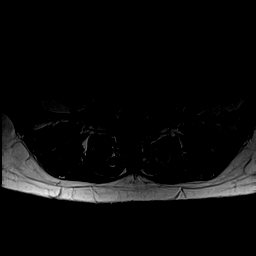
[im 8/38]
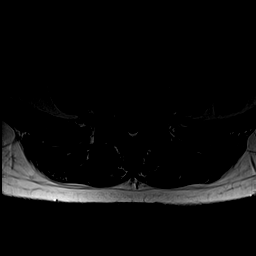
[im 11/38]
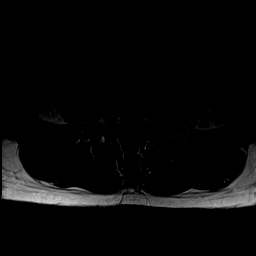
[im 16/38]
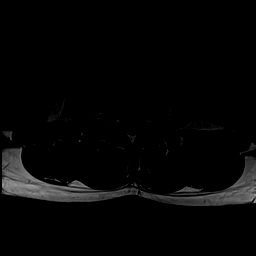
[im 19/38]
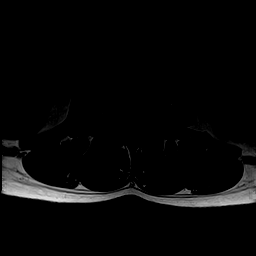
[im 22/38]
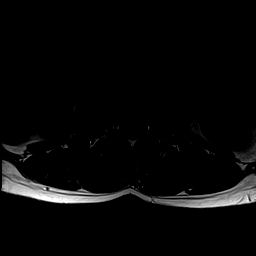
[im 27/38]
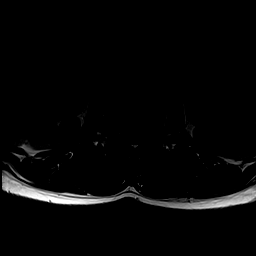
[im 32/38]
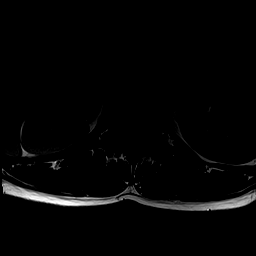
[im 38/38]
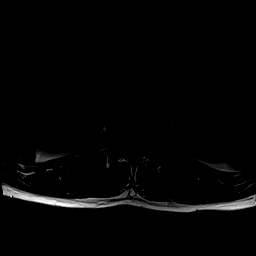

[Series 7: T2 · axial · 4.0mm · 0.70mm/px · z∈[-157,+46]mm · 15 of 38 slices shown (2 of 2)]
[im 1/38]
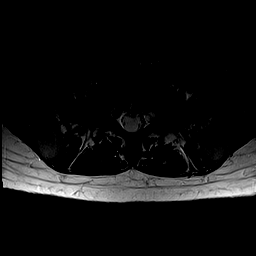
[im 3/38]
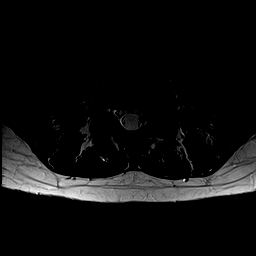
[im 6/38]
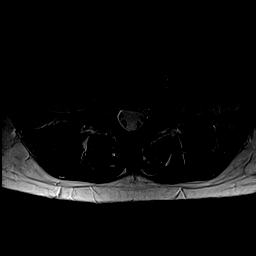
[im 8/38]
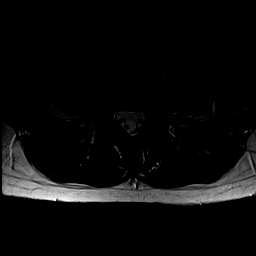
[im 11/38]
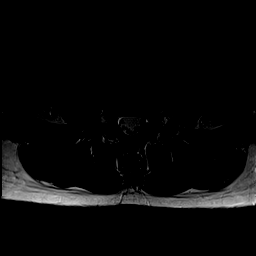
[im 14/38]
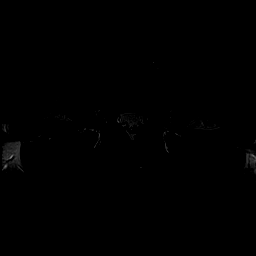
[im 16/38]
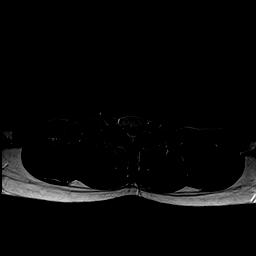
[im 19/38]
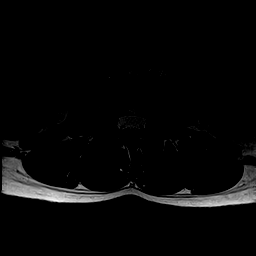
[im 22/38]
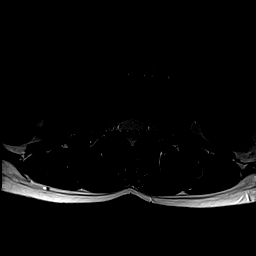
[im 24/38]
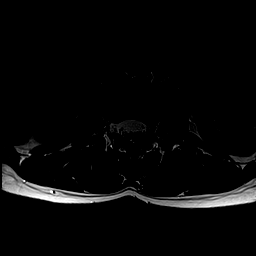
[im 27/38]
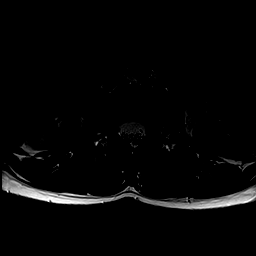
[im 30/38]
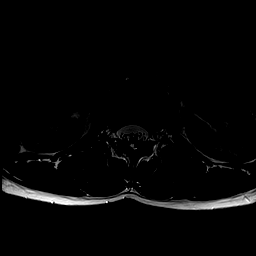
[im 32/38]
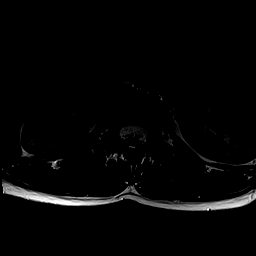
[im 35/38]
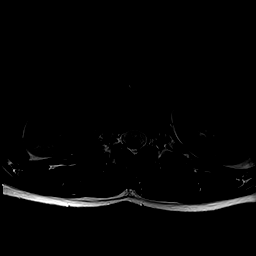
[im 38/38]
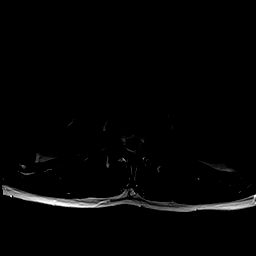

[44 of 48 positions shown; findings below may reference images not displayed]

FINDINGS: Segmentation: Assumed standard. The last well-formed disc space is
designated L5-S1 for the purposes of this report.

Alignment:  Physiologic.

Vertebrae:  No fracture, evidence of discitis, or bone lesion.

Conus medullaris and cauda equina: Conus extends to the L1-L2 level.
Conus and cauda equina appear normal.

Paraspinal and other soft tissues: 1.0 cm simple cyst in the lower
pole of the right kidney. Otherwise negative.

Disc levels:

T11-T12 to L2-L3: Negative.

L3-L4: Minimal disc desiccation without significant disc bulge or
herniation. No stenosis.

L4-L5: Minimal disc desiccation without significant disc bulge or
herniation. No stenosis.

L5-S1: Small focal broad-based left subarticular and foraminal disc
protrusion with annular fissure. Posterior displacement of the
descending left S1 nerve root without impingement. New mild left
neuroforaminal stenosis. No spinal canal or right neuroforaminal
stenosis.
IMPRESSION: 1. New broad-based left subarticular and foraminal disc
protrusion/annular fissure at L5-S1 with potential irritation of the
descending left S1 nerve root but no clear impingement. Mild left
neuroforaminal stenosis.
2. Minimal disc degeneration at L3-L4 and L4-L5.
